# Patient Record
Sex: Female | Born: 1980 | Race: Black or African American | Hispanic: No | Marital: Single | Smoking: Never smoker
Health system: Southern US, Community
[De-identification: ages and names within clinical notes are randomized; demographics above are authoritative.]

## PROBLEM LIST (undated history)

## (undated) ENCOUNTER — Inpatient Hospital Stay (HOSPITAL_COMMUNITY): Payer: Self-pay

## (undated) DIAGNOSIS — Z789 Other specified health status: Secondary | ICD-10-CM

## (undated) HISTORY — PX: NO PAST SURGERIES: SHX2092

---
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
## 2001-04-25 ENCOUNTER — Emergency Department (HOSPITAL_COMMUNITY): Admission: EM | Admit: 2001-04-25 | Discharge: 2001-04-25 | Payer: Self-pay | Admitting: Emergency Medicine

## 2001-11-26 ENCOUNTER — Inpatient Hospital Stay (HOSPITAL_COMMUNITY): Admission: AD | Admit: 2001-11-26 | Discharge: 2001-11-26 | Payer: Self-pay | Admitting: Obstetrics and Gynecology

## 2001-12-23 ENCOUNTER — Emergency Department (HOSPITAL_COMMUNITY): Admission: EM | Admit: 2001-12-23 | Discharge: 2001-12-23 | Payer: Self-pay | Admitting: Emergency Medicine

## 2002-01-30 ENCOUNTER — Encounter: Admission: RE | Admit: 2002-01-30 | Discharge: 2002-01-30 | Payer: Self-pay | Admitting: Internal Medicine

## 2002-02-13 ENCOUNTER — Encounter: Payer: Self-pay | Admitting: *Deleted

## 2002-02-13 ENCOUNTER — Inpatient Hospital Stay (HOSPITAL_COMMUNITY): Admission: AD | Admit: 2002-02-13 | Discharge: 2002-02-13 | Payer: Self-pay | Admitting: Obstetrics and Gynecology

## 2002-03-16 ENCOUNTER — Ambulatory Visit (HOSPITAL_COMMUNITY): Admission: RE | Admit: 2002-03-16 | Discharge: 2002-03-16 | Payer: Self-pay | Admitting: *Deleted

## 2002-05-11 ENCOUNTER — Ambulatory Visit (HOSPITAL_COMMUNITY): Admission: RE | Admit: 2002-05-11 | Discharge: 2002-05-11 | Payer: Self-pay | Admitting: *Deleted

## 2002-10-14 ENCOUNTER — Encounter (HOSPITAL_COMMUNITY): Admission: RE | Admit: 2002-10-14 | Discharge: 2002-10-17 | Payer: Self-pay | Admitting: Family Medicine

## 2002-10-18 ENCOUNTER — Inpatient Hospital Stay (HOSPITAL_COMMUNITY): Admission: AD | Admit: 2002-10-18 | Discharge: 2002-10-20 | Payer: Self-pay | Admitting: *Deleted

## 2003-02-25 ENCOUNTER — Inpatient Hospital Stay (HOSPITAL_COMMUNITY): Admission: AD | Admit: 2003-02-25 | Discharge: 2003-02-25 | Payer: Self-pay | Admitting: *Deleted

## 2003-07-04 ENCOUNTER — Emergency Department (HOSPITAL_COMMUNITY): Admission: EM | Admit: 2003-07-04 | Discharge: 2003-07-04 | Payer: Self-pay | Admitting: Emergency Medicine

## 2003-11-25 ENCOUNTER — Inpatient Hospital Stay (HOSPITAL_COMMUNITY): Admission: AD | Admit: 2003-11-25 | Discharge: 2003-11-25 | Payer: Self-pay | Admitting: Obstetrics & Gynecology

## 2004-03-12 ENCOUNTER — Inpatient Hospital Stay (HOSPITAL_COMMUNITY): Admission: AD | Admit: 2004-03-12 | Discharge: 2004-03-12 | Payer: Self-pay | Admitting: *Deleted

## 2004-07-13 ENCOUNTER — Inpatient Hospital Stay (HOSPITAL_COMMUNITY): Admission: AD | Admit: 2004-07-13 | Discharge: 2004-07-13 | Payer: Self-pay | Admitting: *Deleted

## 2004-11-25 ENCOUNTER — Inpatient Hospital Stay (HOSPITAL_COMMUNITY): Admission: AD | Admit: 2004-11-25 | Discharge: 2004-11-25 | Payer: Self-pay | Admitting: Obstetrics and Gynecology

## 2005-01-31 ENCOUNTER — Ambulatory Visit: Payer: Self-pay | Admitting: Internal Medicine

## 2005-02-07 ENCOUNTER — Ambulatory Visit (HOSPITAL_COMMUNITY): Admission: RE | Admit: 2005-02-07 | Discharge: 2005-02-07 | Payer: Self-pay | Admitting: Obstetrics and Gynecology

## 2005-02-16 ENCOUNTER — Ambulatory Visit: Payer: Self-pay | Admitting: Internal Medicine

## 2005-12-07 ENCOUNTER — Inpatient Hospital Stay (HOSPITAL_COMMUNITY): Admission: AD | Admit: 2005-12-07 | Discharge: 2005-12-07 | Payer: Self-pay | Admitting: Obstetrics & Gynecology

## 2006-05-05 ENCOUNTER — Inpatient Hospital Stay (HOSPITAL_COMMUNITY): Admission: AD | Admit: 2006-05-05 | Discharge: 2006-05-05 | Payer: Self-pay | Admitting: Obstetrics and Gynecology

## 2006-05-05 ENCOUNTER — Encounter (INDEPENDENT_AMBULATORY_CARE_PROVIDER_SITE_OTHER): Payer: Self-pay | Admitting: Specialist

## 2006-05-07 ENCOUNTER — Inpatient Hospital Stay (HOSPITAL_COMMUNITY): Admission: AD | Admit: 2006-05-07 | Discharge: 2006-05-07 | Payer: Self-pay | Admitting: Family Medicine

## 2006-05-24 DIAGNOSIS — N949 Unspecified condition associated with female genital organs and menstrual cycle: Secondary | ICD-10-CM | POA: Insufficient documentation

## 2006-06-27 DIAGNOSIS — N940 Mittelschmerz: Secondary | ICD-10-CM | POA: Insufficient documentation

## 2007-07-15 ENCOUNTER — Inpatient Hospital Stay (HOSPITAL_COMMUNITY): Admission: AD | Admit: 2007-07-15 | Discharge: 2007-07-15 | Payer: Self-pay | Admitting: Obstetrics

## 2007-11-24 ENCOUNTER — Inpatient Hospital Stay (HOSPITAL_COMMUNITY): Admission: AD | Admit: 2007-11-24 | Discharge: 2007-11-24 | Payer: Self-pay | Admitting: Obstetrics & Gynecology

## 2008-04-10 ENCOUNTER — Inpatient Hospital Stay (HOSPITAL_COMMUNITY): Admission: AD | Admit: 2008-04-10 | Discharge: 2008-04-10 | Payer: Self-pay | Admitting: Family Medicine

## 2010-01-10 ENCOUNTER — Inpatient Hospital Stay (HOSPITAL_COMMUNITY): Admission: AD | Admit: 2010-01-10 | Discharge: 2010-01-11 | Payer: Self-pay | Admitting: Obstetrics and Gynecology

## 2010-01-10 ENCOUNTER — Ambulatory Visit: Payer: Self-pay | Admitting: Nurse Practitioner

## 2010-04-09 ENCOUNTER — Ambulatory Visit: Payer: Self-pay | Admitting: Advanced Practice Midwife

## 2010-04-09 ENCOUNTER — Inpatient Hospital Stay (HOSPITAL_COMMUNITY): Admission: AD | Admit: 2010-04-09 | Discharge: 2010-04-09 | Payer: Self-pay | Admitting: Obstetrics & Gynecology

## 2010-04-12 ENCOUNTER — Inpatient Hospital Stay (HOSPITAL_COMMUNITY): Admission: AD | Admit: 2010-04-12 | Discharge: 2010-04-12 | Payer: Self-pay | Admitting: Obstetrics & Gynecology

## 2010-04-12 ENCOUNTER — Ambulatory Visit: Payer: Self-pay | Admitting: Nurse Practitioner

## 2010-07-12 ENCOUNTER — Inpatient Hospital Stay (HOSPITAL_COMMUNITY)
Admission: AD | Admit: 2010-07-12 | Discharge: 2010-07-12 | Payer: Self-pay | Source: Home / Self Care | Attending: Obstetrics & Gynecology | Admitting: Obstetrics & Gynecology

## 2010-07-12 LAB — URINALYSIS, ROUTINE W REFLEX MICROSCOPIC
Bilirubin Urine: NEGATIVE
Ketones, ur: NEGATIVE mg/dL
Leukocytes, UA: NEGATIVE
Nitrite: NEGATIVE
Protein, ur: NEGATIVE mg/dL
Specific Gravity, Urine: >1.03 — ABNORMAL HIGH (ref 1.005–1.030)
Urine Glucose, Fasting: NEGATIVE mg/dL
Urobilinogen, UA: 0.2 mg/dL (ref 0.0–1.0)
pH: 6 (ref 5.0–8.0)

## 2010-07-12 LAB — URINE MICROSCOPIC-ADD ON

## 2010-07-12 LAB — WET PREP, GENITAL
Trich, Wet Prep: NONE SEEN
Yeast Wet Prep HPF POC: NONE SEEN

## 2010-07-12 LAB — POCT PREGNANCY, URINE: Preg Test, Ur: NEGATIVE

## 2010-07-13 LAB — GC/CHLAMYDIA PROBE AMP, GENITAL
Chlamydia, DNA Probe: NEGATIVE
GC Probe Amp, Genital: NEGATIVE

## 2010-09-20 LAB — WET PREP, GENITAL: Trich, Wet Prep: NONE SEEN

## 2010-09-24 LAB — URINE MICROSCOPIC-ADD ON

## 2010-09-24 LAB — URINALYSIS, ROUTINE W REFLEX MICROSCOPIC
Bilirubin Urine: NEGATIVE
Glucose, UA: NEGATIVE mg/dL
Ketones, ur: NEGATIVE mg/dL
Leukocytes, UA: NEGATIVE
Nitrite: NEGATIVE
Protein, ur: NEGATIVE mg/dL
Specific Gravity, Urine: >1.03 — ABNORMAL HIGH (ref 1.005–1.030)
Urobilinogen, UA: 0.2 mg/dL (ref 0.0–1.0)
pH: 6 (ref 5.0–8.0)

## 2010-09-24 LAB — WET PREP, GENITAL
Trich, Wet Prep: NONE SEEN
Yeast Wet Prep HPF POC: NONE SEEN

## 2010-09-24 LAB — CBC
HCT: 35.6 % — ABNORMAL LOW (ref 36.0–46.0)
Hemoglobin: 11.7 g/dL — ABNORMAL LOW (ref 12.0–15.0)
MCH: 28.4 pg (ref 26.0–34.0)
MCHC: 32.8 g/dL (ref 30.0–36.0)
MCV: 86.6 fL (ref 78.0–100.0)
Platelets: 193 10*3/uL (ref 150–400)
RBC: 4.11 MIL/uL (ref 3.87–5.11)
RDW: 14.6 % (ref 11.5–15.5)
WBC: 8 10*3/uL (ref 4.0–10.5)

## 2010-09-24 LAB — GC/CHLAMYDIA PROBE AMP, GENITAL
Chlamydia, DNA Probe: NEGATIVE
GC Probe Amp, Genital: NEGATIVE

## 2010-09-24 LAB — POCT PREGNANCY, URINE: Preg Test, Ur: NEGATIVE

## 2011-03-28 LAB — URINALYSIS, ROUTINE W REFLEX MICROSCOPIC
Bilirubin Urine: NEGATIVE
Glucose, UA: NEGATIVE
Ketones, ur: NEGATIVE
Nitrite: NEGATIVE
Protein, ur: NEGATIVE
Specific Gravity, Urine: 1.02
Urobilinogen, UA: 0.2
pH: 6.5

## 2011-03-28 LAB — URINE MICROSCOPIC-ADD ON

## 2011-03-28 LAB — POCT PREGNANCY, URINE
Operator id: 220991
Preg Test, Ur: NEGATIVE

## 2011-04-04 LAB — URINALYSIS, ROUTINE W REFLEX MICROSCOPIC
Bilirubin Urine: NEGATIVE
Glucose, UA: NEGATIVE
Hgb urine dipstick: NEGATIVE
Ketones, ur: 15 — AB
Nitrite: NEGATIVE
Protein, ur: NEGATIVE
Specific Gravity, Urine: 1.025
Urobilinogen, UA: 0.2
pH: 7

## 2011-04-04 LAB — POCT PREGNANCY, URINE
Operator id: 120561
Preg Test, Ur: NEGATIVE

## 2011-04-09 LAB — URINE MICROSCOPIC-ADD ON

## 2011-04-09 LAB — POCT PREGNANCY, URINE: Preg Test, Ur: NEGATIVE

## 2011-04-09 LAB — URINALYSIS, ROUTINE W REFLEX MICROSCOPIC
Bilirubin Urine: NEGATIVE
Glucose, UA: NEGATIVE
Ketones, ur: NEGATIVE
Nitrite: NEGATIVE
Protein, ur: NEGATIVE
Specific Gravity, Urine: 1.025
Urobilinogen, UA: 0.2
pH: 6

## 2011-04-09 LAB — WET PREP, GENITAL

## 2011-04-09 LAB — GC/CHLAMYDIA PROBE AMP, GENITAL: Chlamydia, DNA Probe: NEGATIVE

## 2012-07-27 ENCOUNTER — Inpatient Hospital Stay (HOSPITAL_COMMUNITY)
Admission: AD | Admit: 2012-07-27 | Discharge: 2012-07-27 | Disposition: A | Discharge status: Home / Self Care | Payer: Self-pay | Source: Ambulatory Visit | Attending: Obstetrics & Gynecology | Admitting: Obstetrics & Gynecology

## 2012-07-27 ENCOUNTER — Encounter (HOSPITAL_COMMUNITY): Payer: Self-pay | Admitting: *Deleted

## 2012-07-27 DIAGNOSIS — L293 Anogenital pruritus, unspecified: Secondary | ICD-10-CM | POA: Insufficient documentation

## 2012-07-27 DIAGNOSIS — N760 Acute vaginitis: Secondary | ICD-10-CM

## 2012-07-27 DIAGNOSIS — B9689 Other specified bacterial agents as the cause of diseases classified elsewhere: Secondary | ICD-10-CM

## 2012-07-27 DIAGNOSIS — R109 Unspecified abdominal pain: Secondary | ICD-10-CM

## 2012-07-27 DIAGNOSIS — B373 Candidiasis of vulva and vagina: Secondary | ICD-10-CM | POA: Insufficient documentation

## 2012-07-27 DIAGNOSIS — A499 Bacterial infection, unspecified: Secondary | ICD-10-CM

## 2012-07-27 DIAGNOSIS — B3731 Acute candidiasis of vulva and vagina: Secondary | ICD-10-CM

## 2012-07-27 HISTORY — DX: Other specified health status: Z78.9

## 2012-07-27 LAB — URINE MICROSCOPIC-ADD ON

## 2012-07-27 LAB — URINALYSIS, ROUTINE W REFLEX MICROSCOPIC
Glucose, UA: NEGATIVE mg/dL
Ketones, ur: NEGATIVE mg/dL
Nitrite: NEGATIVE
Protein, ur: NEGATIVE mg/dL
pH: 7 (ref 5.0–8.0)

## 2012-07-27 LAB — WET PREP, GENITAL: Yeast Wet Prep HPF POC: NONE SEEN

## 2012-07-27 MED ORDER — FLUCONAZOLE 150 MG PO TABS: 150.0000 mg | ORAL_TABLET | Freq: Once | ORAL | Status: DC

## 2012-07-27 MED ORDER — NORGESTIMATE-ETH ESTRADIOL 0.25-35 MG-MCG PO TABS: 1.0000 | ORAL_TABLET | Freq: Every day | ORAL | Status: DC

## 2012-07-27 MED ORDER — METRONIDAZOLE 500 MG PO TABS: 500.0000 mg | ORAL_TABLET | Freq: Two times a day (BID) | ORAL | Status: DC

## 2012-07-27 NOTE — MAU Provider Note (Signed)
History     CSN: [id number]  Arrival date and time: 07/27/12 4098   First Provider Initiated Contact with Patient 07/27/12 [phone]      Chief Complaint  Patient presents with  . Abdominal Pain   HPI  Pt is not pregnant and complains of vaginal itching- she is also having vaginal/perineal pain.  She used 1 day cream for yeast without relief.  She has had some nausea but no vomiting.  She has RCM.  She is not using anything for contraception.  Pt is also having right mid abdominal cramping that comes and goes.  Pt is constipated and has not had a bowel movement since last week.  Past Medical History  Diagnosis Date  . No pertinent past medical history     Past Surgical History  Procedure Date  . No past surgeries     No family history on file.  History  Substance Use Topics  . Smoking status: Never Smoker   . Smokeless tobacco: Not on file  . Alcohol Use: No    Allergies: No Known Allergies  No prescriptions prior to admission    Review of Systems  Constitutional: Negative for fever and chills.  Gastrointestinal: Positive for nausea, abdominal pain and constipation. Negative for vomiting and diarrhea.  Genitourinary: Negative for dysuria and urgency.  Musculoskeletal: Negative for back pain.   Physical Exam   Blood pressure 119/74, pulse 81, temperature 97.6 F (36.4 C), temperature source Oral, resp. rate 18, height 5\' 3"  (1.6 m), weight 173 lb 12.8 oz (78.835 kg), last menstrual period 07/15/2012.  Physical Exam  Nursing note and vitals reviewed. Constitutional: She is oriented to person, place, and time. She appears well-developed and well-nourished.  HENT:  Head: Normocephalic.  Eyes: Pupils are equal, round, and reactive to light.  Neck: Normal range of motion. Neck supple.  Cardiovascular: Normal rate.   Respiratory: Effort normal and breath sounds normal.  GI: Soft. She exhibits no distension. There is no tenderness. There is no rebound and no guarding.   Genitourinary: Vagina normal.       Vaginal mucosa reddened and slightly edematous; no lesions noted; mod amount of white clumpy discharge in vault (?discharge vs. Cream) and some frothiness noted.  Cervix clean, NT; uterus NSSC NT; adnexal without palpable enlargement- mild tenderness right adnexa- no rebound  Musculoskeletal: Normal range of motion.  Neurological: She is alert and oriented to person, place, and time.  Skin: Skin is warm and dry.  Psychiatric: She has a normal mood and affect.    MAU Course  Procedures Results for orders placed during the hospital encounter of 07/27/12 (from the past 24 hour(s))  URINALYSIS, ROUTINE W REFLEX MICROSCOPIC     Status: Abnormal   Collection Time   07/27/12  7:25 AM      Component Value Range   Color, Urine YELLOW  YELLOW   APPearance CLOUDY (*) CLEAR   Specific Gravity, Urine 1.020  1.005 - 1.030   pH 7.0  5.0 - 8.0   Glucose, UA NEGATIVE  NEGATIVE mg/dL   Hgb urine dipstick NEGATIVE  NEGATIVE   Bilirubin Urine NEGATIVE  NEGATIVE   Ketones, ur NEGATIVE  NEGATIVE mg/dL   Protein, ur NEGATIVE  NEGATIVE mg/dL   Urobilinogen, UA 2.0 (*) 0.0 - 1.0 mg/dL   Nitrite NEGATIVE  NEGATIVE   Leukocytes, UA MODERATE (*) NEGATIVE  URINE MICROSCOPIC-ADD ON     Status: Abnormal   Collection Time   07/27/12  7:25 AM  Component Value Range   Squamous Epithelial / LPF FEW (*) RARE   WBC, UA 21-50  <3 WBC/hpf   RBC / HPF 0-2  <3 RBC/hpf   Bacteria, UA MANY (*) RARE  WET PREP, GENITAL     Status: Abnormal   Collection Time   07/27/12  8:45 AM      Component Value Range   Yeast Wet Prep HPF POC NONE SEEN  NONE SEEN   Trich, Wet Prep NONE SEEN  NONE SEEN   Clue Cells Wet Prep HPF POC FEW (*) NONE SEEN   WBC, Wet Prep HPF POC MANY (*) NONE SEEN    Assessment and Plan  Bacterial vaginosis- Flagyl 500mg  BID for 7 days ?yeast- Diflucan 150mg  - one now and repeat in 3 days if still having symptoms Contraception advised- information on choices  given to pt- f/u with [facility]  Jaylissa Felty 07/27/2012, 8:26 AM

## 2012-07-27 NOTE — MAU Note (Signed)
Pt reports she started having abd pain since Thursday. Thought she might have a yeast infection and took the one day treatment and it did not work pain is worse.

## 2012-07-28 LAB — URINE CULTURE: Colony Count: >=100000

## 2012-07-28 LAB — GC/CHLAMYDIA PROBE AMP
CT Probe RNA: NEGATIVE
GC Probe RNA: NEGATIVE

## 2012-07-28 NOTE — MAU Provider Note (Signed)
Attestation of Attending Supervision of Advanced Practitioner: Evaluation and management procedures were performed by the PA/NP/CNM/OB Fellow under my supervision/collaboration. Chart reviewed and agree with management and plan.  Gumaro Brightbill V 07/28/2012 10:26 PM

## 2013-12-03 ENCOUNTER — Encounter (HOSPITAL_COMMUNITY): Payer: Self-pay | Admitting: *Deleted

## 2013-12-03 ENCOUNTER — Inpatient Hospital Stay (HOSPITAL_COMMUNITY)
Admission: AD | Admit: 2013-12-03 | Discharge: 2013-12-03 | Disposition: A | Discharge status: Home / Self Care | Payer: Managed Care, Other (non HMO) | Source: Ambulatory Visit | Attending: Obstetrics & Gynecology | Admitting: Obstetrics & Gynecology

## 2013-12-03 DIAGNOSIS — N760 Acute vaginitis: Secondary | ICD-10-CM

## 2013-12-03 DIAGNOSIS — R109 Unspecified abdominal pain: Secondary | ICD-10-CM

## 2013-12-03 DIAGNOSIS — B9689 Other specified bacterial agents as the cause of diseases classified elsewhere: Secondary | ICD-10-CM

## 2013-12-03 DIAGNOSIS — B373 Candidiasis of vulva and vagina: Secondary | ICD-10-CM

## 2013-12-03 DIAGNOSIS — B3731 Acute candidiasis of vulva and vagina: Secondary | ICD-10-CM

## 2013-12-03 DIAGNOSIS — N898 Other specified noninflammatory disorders of vagina: Secondary | ICD-10-CM | POA: Insufficient documentation

## 2013-12-03 DIAGNOSIS — N949 Unspecified condition associated with female genital organs and menstrual cycle: Secondary | ICD-10-CM | POA: Insufficient documentation

## 2013-12-03 DIAGNOSIS — R102 Pelvic and perineal pain: Secondary | ICD-10-CM

## 2013-12-03 DIAGNOSIS — A499 Bacterial infection, unspecified: Secondary | ICD-10-CM

## 2013-12-03 LAB — WET PREP, GENITAL
Trich, Wet Prep: NONE SEEN
Yeast Wet Prep HPF POC: NONE SEEN

## 2013-12-03 LAB — URINE MICROSCOPIC-ADD ON

## 2013-12-03 LAB — URINALYSIS, ROUTINE W REFLEX MICROSCOPIC
BILIRUBIN URINE: NEGATIVE
Glucose, UA: NEGATIVE mg/dL
KETONES UR: 15 mg/dL — AB
NITRITE: NEGATIVE
PROTEIN: NEGATIVE mg/dL
SPECIFIC GRAVITY, URINE: 1.02 (ref 1.005–1.030)
UROBILINOGEN UA: 1 mg/dL (ref 0.0–1.0)
pH: 6 (ref 5.0–8.0)

## 2013-12-03 LAB — POCT PREGNANCY, URINE: PREG TEST UR: NEGATIVE

## 2013-12-03 NOTE — MAU Provider Note (Signed)
History     CSN: [id number]  Arrival date and time: 12/03/13 2019   First Provider Initiated Contact with Patient 12/03/13 2121      No chief complaint on file.  HPI  Diane Sullivan is a 33 y.o. G2P1011 who presents today with lower abdominal cramping and vaginal discharge. She states that the cramping and discharge started a "couple of days ago". [facility], but couldn't make an appointment because of her work hours. She has not tried any medication for the cramping. She rates her pain 4/10 at this time.   Past Medical History  Diagnosis Date  . No pertinent past medical history     Past Surgical History  Procedure Laterality Date  . No past surgeries      Family History  Problem Relation Age of Onset  . Hypertension Mother   . Cancer Maternal Aunt   . Cancer Maternal Grandmother   . Heart disease Neg Hx   . Diabetes Neg Hx     History  Substance Use Topics  . Smoking status: Never Smoker   . Smokeless tobacco: Not on file  . Alcohol Use: No    Allergies: No Known Allergies  Prescriptions prior to admission  Medication Sig Dispense Refill  . fluconazole (DIFLUCAN) 150 MG tablet Take 1 tablet (150 mg total) by mouth once.  2 tablet  3  . metroNIDAZOLE (FLAGYL) 500 MG tablet Take 1 tablet (500 mg total) by mouth 2 (two) times daily.  14 tablet  0  . norgestimate-ethinyl estradiol (ORTHO-CYCLEN,SPRINTEC,PREVIFEM) 0.25-35 MG-MCG tablet Take 1 tablet by mouth daily.  1 Package  11    ROS Physical Exam   Blood pressure 119/76, pulse 84, temperature 98.3 F (36.8 C), temperature source Oral, resp. rate 18.  Physical Exam  Nursing note and vitals reviewed. Constitutional: She is oriented to person, place, and time. She appears well-developed and well-nourished. No distress.  Cardiovascular: Normal rate.   Respiratory: Effort normal.  GI: Soft.  Genitourinary:   External: no lesion Vagina: small amount of white  discharge Cervix: pink, small herpatic appearing lesion at 2:00 on the cervix. Culture obtained.  no CMT Uterus: NSSC Adnexa: NT   Neurological: She is alert and oriented to person, place, and time.  Skin: Skin is warm and dry.  Psychiatric: She has a normal mood and affect.    MAU Course  Procedures  Results for orders placed during the hospital encounter of 12/03/13 (from the past 24 hour(s))  URINALYSIS, ROUTINE W REFLEX MICROSCOPIC     Status: Abnormal   Collection Time    12/03/13  9:10 PM      Result Value Ref Range   Color, Urine YELLOW  YELLOW   APPearance CLEAR  CLEAR   Specific Gravity, Urine 1.020  1.005 - 1.030   pH 6.0  5.0 - 8.0   Glucose, UA NEGATIVE  NEGATIVE mg/dL   Hgb urine dipstick TRACE (*) NEGATIVE   Bilirubin Urine NEGATIVE  NEGATIVE   Ketones, ur 15 (*) NEGATIVE mg/dL   Protein, ur NEGATIVE  NEGATIVE mg/dL   Urobilinogen, UA 1.0  0.0 - 1.0 mg/dL   Nitrite NEGATIVE  NEGATIVE   Leukocytes, UA SMALL (*) NEGATIVE  URINE MICROSCOPIC-ADD ON     Status: Abnormal   Collection Time    12/03/13  9:10 PM      Result Value Ref Range   Squamous Epithelial / LPF FEW (*) RARE   WBC, UA 3-6  <  3 WBC/hpf   Bacteria, UA FEW (*) RARE   Urine-Other MUCOUS PRESENT    POCT PREGNANCY, URINE     Status: None   Collection Time    12/03/13  9:15 PM      Result Value Ref Range   Preg Test, Ur NEGATIVE  NEGATIVE  WET PREP, GENITAL     Status: Abnormal   Collection Time    12/03/13  9:35 PM      Result Value Ref Range   Yeast Wet Prep HPF POC NONE SEEN  NONE SEEN   Trich, Wet Prep NONE SEEN  NONE SEEN   Clue Cells Wet Prep HPF POC FEW (*) NONE SEEN   WBC, Wet Prep HPF POC FEW (*) NONE SEEN     Assessment and Plan   1. Pelvic pain    Urine Culture, GC/CT and HSV cultures pending Return to MAU as needed  Follow-up Information   Follow up with [facility] [place]. (As needed)    Contact information:   [address] [place] [address] [phone]        Tawnya Crook 12/03/2013, 9:27 PM

## 2013-12-03 NOTE — Discharge Instructions (Signed)
Safe Sex Safe sex is about reducing the risk of giving or getting a sexually transmitted disease (STD). STDs are spread through sexual contact involving the genitals, mouth, or rectum. Some STDS can be cured and others cannot. Safe sex can also prevent unintended pregnancies.  SAFE SEX PRACTICES  Limit your sexual activity to only one partner who is only having sex with you.  Talk to your partner about their past partners, past STDs, and drug use.  Use a condom every time you have sexual intercourse. This includes vaginal, oral, and anal sexual activity. Both females and males should wear condoms during oral sex. Only use latex or polyurethane condoms and water-based lubricants. Petroleum-based lubricants or oils used to lubricate a condom will weaken the condom and increase the chance that it will break. The condom should be in place from the beginning to the end of sexual activity. Wearing a condom reduces, but does not completely eliminate, your risk of getting or giving a STD. STDs can be spread by contact with skin of surrounding areas.  Get vaccinated for hepatitis B and HPV.  Avoid alcohol and recreational drugs which can affect your judgement. You may forget to use a condom or participate in high-risk sex.  For females, avoid douching after sexual intercourse. Douching can spread an infection farther into the reproductive tract.  Check your body for signs of sores, blisters, rashes, or unusual discharge. See your caregiver if you notice any of these signs.  Avoid sexual contact if you have symptoms of an infection or are being treated for an STD. If you or your partner has herpes, avoid sexual contact when blisters are present. Use condoms at all other times.  See your caregiver for regular screenings, examinations, and tests for STDs. Before having sex with a new partner, each of you should be screened for STDs and talk about the results with your partner. BENEFITS OF SAFE SEX   There  is less of a chance of getting or giving an STD.  You can prevent unwanted or unintended pregnancies.  By discussing safer sex concerns with your partner, you may increase feelings of intimacy, comfort, trust, and honesty between the both of you. Document Released: 08/02/2004 Document Revised: 03/19/2012 Document Reviewed: 12/17/2011 ExitCare Patient Information 2014 [employer].  

## 2013-12-03 NOTE — MAU Note (Signed)
Pt reports stomach pain and discharge since Saturday. Pt states that LMP was 5/9, but a condom broke during intercourse.

## 2013-12-03 NOTE — MAU Note (Signed)
Pt not in waiting area when called by nurse first for info.

## 2013-12-04 LAB — URINE CULTURE
Colony Count: NO GROWTH
Culture: NO GROWTH

## 2013-12-04 LAB — GC/CHLAMYDIA PROBE AMP
CT PROBE, AMP APTIMA: NEGATIVE
GC PROBE AMP APTIMA: NEGATIVE

## 2013-12-05 LAB — HSV PCR
HSV 2 , PCR: NOT DETECTED
HSV BY PCR: NOT DETECTED

## 2013-12-08 NOTE — MAU Provider Note (Signed)
Attestation of Attending Supervision of Advanced Practitioner (CNM/NP): Evaluation and management procedures were performed by the Advanced Practitioner under my supervision and collaboration. I have reviewed the Advanced Practitioner's note and chart, and I agree with the management and plan.  Diane Sullivan 2:42 PM

## 2013-12-20 ENCOUNTER — Encounter (HOSPITAL_COMMUNITY): Payer: Self-pay | Admitting: *Deleted

## 2013-12-20 ENCOUNTER — Inpatient Hospital Stay (HOSPITAL_COMMUNITY)
Admission: AD | Admit: 2013-12-20 | Discharge: 2013-12-20 | Disposition: A | Discharge status: Home / Self Care | Payer: Managed Care, Other (non HMO) | Source: Ambulatory Visit | Attending: Family Medicine | Admitting: Family Medicine

## 2013-12-20 DIAGNOSIS — N760 Acute vaginitis: Secondary | ICD-10-CM | POA: Insufficient documentation

## 2013-12-20 DIAGNOSIS — Z309 Encounter for contraceptive management, unspecified: Secondary | ICD-10-CM | POA: Insufficient documentation

## 2013-12-20 DIAGNOSIS — B9689 Other specified bacterial agents as the cause of diseases classified elsewhere: Secondary | ICD-10-CM | POA: Insufficient documentation

## 2013-12-20 DIAGNOSIS — A499 Bacterial infection, unspecified: Secondary | ICD-10-CM | POA: Insufficient documentation

## 2013-12-20 LAB — WET PREP, GENITAL
Trich, Wet Prep: NONE SEEN
Yeast Wet Prep HPF POC: NONE SEEN

## 2013-12-20 LAB — URINALYSIS, ROUTINE W REFLEX MICROSCOPIC
BILIRUBIN URINE: NEGATIVE
GLUCOSE, UA: NEGATIVE mg/dL
KETONES UR: NEGATIVE mg/dL
LEUKOCYTES UA: NEGATIVE
NITRITE: NEGATIVE
PROTEIN: NEGATIVE mg/dL
Specific Gravity, Urine: 1.025 (ref 1.005–1.030)
Urobilinogen, UA: 1 mg/dL (ref 0.0–1.0)
pH: 6 (ref 5.0–8.0)

## 2013-12-20 LAB — URINE MICROSCOPIC-ADD ON

## 2013-12-20 LAB — POCT PREGNANCY, URINE: PREG TEST UR: NEGATIVE

## 2013-12-20 MED ORDER — METRONIDAZOLE 500 MG PO TABS: 500.0000 mg | ORAL_TABLET | Freq: Two times a day (BID) | ORAL | Status: DC

## 2013-12-20 MED ORDER — FLUCONAZOLE 150 MG PO TABS: 150.0000 mg | ORAL_TABLET | Freq: Every day | ORAL | Status: DC

## 2013-12-20 MED ORDER — NORELGESTROMIN-ETH ESTRADIOL 150-35 MCG/24HR TD PTWK: 1.0000 | MEDICATED_PATCH | TRANSDERMAL | Status: DC

## 2013-12-20 NOTE — MAU Provider Note (Signed)
History     CSN: [id number]  Arrival date and time: 12/20/13 1349   First Provider Initiated Contact with Patient 12/20/13 1432      Chief Complaint  Patient presents with  . Vaginal Discharge   HPI Comments: Diane Sullivan 33 y.o. [id number] presents to MAU with vaginal discharge. She was here in May with same symptoms but was not treated. She is sexually active with same partner using condoms part of the time. Last unprotected intercourse was last Monday. We discussed birth control options.  Vaginal Discharge      Past Medical History  Diagnosis Date  . No pertinent past medical history   . Medical history non-contributory     Past Surgical History  Procedure Laterality Date  . No past surgeries      Family History  Problem Relation Age of Onset  . Hypertension Mother   . Cancer Maternal Aunt   . Cancer Maternal Grandmother   . Heart disease Neg Hx   . Diabetes Neg Hx     History  Substance Use Topics  . Smoking status: Never Smoker   . Smokeless tobacco: Never Used  . Alcohol Use: No    Allergies: No Known Allergies  Prescriptions prior to admission  Medication Sig Dispense Refill  . fluconazole (DIFLUCAN) 150 MG tablet Take 1 tablet (150 mg total) by mouth once.  2 tablet  3  . metroNIDAZOLE (FLAGYL) 500 MG tablet Take 1 tablet (500 mg total) by mouth 2 (two) times daily.  14 tablet  0  . norgestimate-ethinyl estradiol (ORTHO-CYCLEN,SPRINTEC,PREVIFEM) 0.25-35 MG-MCG tablet Take 1 tablet by mouth daily.  1 Package  11    Review of Systems  Constitutional: Negative.   HENT: Negative.   Eyes: Negative.   Respiratory: Negative.   Cardiovascular: Negative.   Genitourinary: Negative.        Vaginal discharge  Musculoskeletal: Negative.   Skin: Negative.   Neurological: Negative.   Psychiatric/Behavioral: Negative.    Physical Exam   Blood pressure 119/67, pulse 86, temperature 98.2 F (36.8 C), temperature source Oral, resp. rate 16, height 5\' 3"   (1.6 m), weight 79.379 kg (175 lb), last menstrual period 12/11/2013.  Physical Exam  Constitutional: She is oriented to person, place, and time. She appears well-developed and well-nourished. No distress.  HENT:  Head: Normocephalic and atraumatic.  Eyes: Pupils are equal, round, and reactive to light.  GI: Soft. Bowel sounds are normal. She exhibits no distension. There is no tenderness. There is no rebound and no guarding.  Genitourinary:  Genital:external negative Vaginal:moderate amount white odorous discharge Cervix:closed/ thick Bimanual: No CMT   Musculoskeletal: Normal range of motion.  Neurological: She is alert and oriented to person, place, and time.  Skin: Skin is warm and dry.  Psychiatric: She has a normal mood and affect. Her behavior is normal. Judgment and thought content normal.   Results for orders placed during the hospital encounter of 12/20/13 (from the past 24 hour(s))  URINALYSIS, ROUTINE W REFLEX MICROSCOPIC     Status: Abnormal   Collection Time    12/20/13  2:00 PM      Result Value Ref Range   Color, Urine YELLOW  YELLOW   APPearance CLEAR  CLEAR   Specific Gravity, Urine 1.025  1.005 - 1.030   pH 6.0  5.0 - 8.0   Glucose, UA NEGATIVE  NEGATIVE mg/dL   Hgb urine dipstick TRACE (*) NEGATIVE   Bilirubin Urine NEGATIVE  NEGATIVE   Ketones, ur  NEGATIVE  NEGATIVE mg/dL   Protein, ur NEGATIVE  NEGATIVE mg/dL   Urobilinogen, UA 1.0  0.0 - 1.0 mg/dL   Nitrite NEGATIVE  NEGATIVE   Leukocytes, UA NEGATIVE  NEGATIVE  URINE MICROSCOPIC-ADD ON     Status: Abnormal   Collection Time    12/20/13  2:00 PM      Result Value Ref Range   Squamous Epithelial / LPF MANY (*) RARE   RBC / HPF 0-2  <3 RBC/hpf   Urine-Other MUCOUS PRESENT    WET PREP, GENITAL     Status: Abnormal   Collection Time    12/20/13  2:38 PM      Result Value Ref Range   Yeast Wet Prep HPF POC NONE SEEN  NONE SEEN   Trich, Wet Prep NONE SEEN  NONE SEEN   Clue Cells Wet Prep HPF POC MANY  (*) NONE SEEN   WBC, Wet Prep HPF POC FEW (*) NONE SEEN  POCT PREGNANCY, URINE     Status: None   Collection Time    12/20/13  3:00 PM      Result Value Ref Range   Preg Test, Ur NEGATIVE  NEGATIVE    MAU Course  Procedures  MDM  Wet Prep, GC, Chlamydia  Assessment and Plan   A: Bacterial Vaginosis Contraception Management  P: Flagyl 500 mg po BID x 7 days No alcohol/ intercourse x 7 days Ortho Evra Patch # # months refills Make appointment with [facility]  Aadarsh Cozort Miller 12/20/2013, 2:45 PM

## 2013-12-20 NOTE — Discharge Instructions (Signed)
Bacterial Vaginosis Bacterial vaginosis is an infection of the vagina. It happens when too many of certain germs (bacteria) grow in the vagina. HOME CARE  Take your medicine as told by your doctor.  Finish your medicine even if you start to feel better.  Do not have sex until you finish your medicine and are better.  Tell your sex partner that you have an infection. They should see their doctor for treatment.  Practice safe sex. Use condoms. Have only one sex partner. GET HELP IF:  You are not getting better after 3 days of treatment.  You have more grey fluid (discharge) coming from your vagina than before.  You have more pain than before.  You have a fever. MAKE SURE YOU:   Understand these instructions.  Will watch your condition.  Will get help right away if you are not doing well or get worse. Document Released: 04/03/2008 Document Revised: 04/15/2013 Document Reviewed: 02/04/2013 [facility] Patient Information 2014 [place], [place]. Contraception Choices Birth control (contraception) is the use of any methods or devices to stop pregnancy from happening. Below are some methods to help avoid pregnancy. HORMONAL BIRTH CONTROL  A small tube put under the skin of the upper arm (implant). The tube can stay in place for 3 years. The implant must be taken out after 3 years.  Shots given every 3 months.  Pills taken every day.  Patches that are changed once a week.  A ring put into the vagina (vaginal ring). The ring is left in place for 3 weeks and removed for 1 week. Then, a new ring is put in the vagina.  Emergency birth control pills taken after unprotected sex (intercourse). BARRIER BIRTH CONTROL   A thin covering worn on the penis (female condom) during sex.  A soft, loose covering put into the vagina (female condom) before sex.  A rubber bowl that sits over the cervix (diaphragm). The bowl must be made for you. The bowl is put into the vagina before sex. The bowl is  left in place for 6 to 8 hours after sex.  A small, soft cup that fits over the cervix (cervical cap). The cup must be made for you. The cup can be left in place for 48 hours after sex.  A sponge that is put into the vagina before sex.  A chemical that kills or stops sperm from getting into the cervix and uterus (spermicide). The chemical may be a cream, jelly, foam, or pill. INTRAUTERINE (IUD) BIRTH CONTROL   IUD birth control is a small, T-shaped piece of plastic. The plastic is put inside the uterus. There are 2 types of IUD:  Copper IUD. The IUD is covered in copper wire. The copper makes a fluid that kills sperm. It can stay in place for 10 years.  Hormone IUD. The hormone stops pregnancy from happening. It can stay in place for 5 years. PERMANENT METHODS  When the woman has her fallopian tubes sealed, tied, or blocked during surgery. This stops the egg from traveling to the uterus.  The doctor places a small coil or insert into each fallopian tube. This causes scar tissue to form and blocks the fallopian tubes.  When the female has the tubes that carry sperm tied off (vasectomy). NATURAL FAMILY PLANNING BIRTH CONTROL   Natural family planning means not having sex or using barrier birth control on the days the woman could become pregnant.  Use a calendar to keep track of the length of each period  and know the days she can get pregnant. °· Avoid sex during ovulation. °· Use a thermometer to measure body temperature. Also watch for symptoms of ovulation. °· Time sex to be after the woman has ovulated. °Use condoms to help protect yourself against sexually transmitted infections (STIs). Do this no matter what type of birth control you use. Talk to your doctor about which type of birth control is best for you. °Document Released: 04/22/2009 Document Revised: 02/25/2013 Document Reviewed: 01/14/2013 °ExitCare® Patient Information ©2014 [employer]. ° °

## 2013-12-20 NOTE — MAU Note (Signed)
Patient presents with complaints of vaginal irritation, odor and discharge.

## 2013-12-21 LAB — GC/CHLAMYDIA PROBE AMP
CT PROBE, AMP APTIMA: NEGATIVE
GC PROBE AMP APTIMA: NEGATIVE

## 2013-12-21 NOTE — MAU Provider Note (Signed)
Attestation of Attending Supervision of Advanced Practitioner (PA/CNM/NP): Evaluation and management procedures were performed by the Advanced Practitioner under my supervision and collaboration.  I have reviewed the Advanced Practitioner's note and chart, and I agree with the management and plan.  PRATT,TANYA S, MD [facility] Attending 12/21/2013 5:17 AM   

## 2014-05-10 ENCOUNTER — Encounter (HOSPITAL_COMMUNITY): Payer: Self-pay | Admitting: *Deleted

## 2014-05-24 ENCOUNTER — Ambulatory Visit: Payer: Managed Care, Other (non HMO) | Admitting: Obstetrics

## 2015-01-04 ENCOUNTER — Encounter (HOSPITAL_COMMUNITY): Payer: Self-pay

## 2015-01-04 ENCOUNTER — Inpatient Hospital Stay (HOSPITAL_COMMUNITY)
Admit: 2015-01-04 | Discharge: 2015-01-04 | Disposition: A | Discharge status: Home / Self Care | Payer: Managed Care, Other (non HMO) | Source: Ambulatory Visit | Attending: Family Medicine | Admitting: Family Medicine

## 2015-01-04 ENCOUNTER — Inpatient Hospital Stay (HOSPITAL_COMMUNITY): Payer: Managed Care, Other (non HMO)

## 2015-01-04 DIAGNOSIS — N939 Abnormal uterine and vaginal bleeding, unspecified: Secondary | ICD-10-CM | POA: Diagnosis not present

## 2015-01-04 DIAGNOSIS — Z3A00 Weeks of gestation of pregnancy not specified: Secondary | ICD-10-CM | POA: Insufficient documentation

## 2015-01-04 DIAGNOSIS — O209 Hemorrhage in early pregnancy, unspecified: Secondary | ICD-10-CM | POA: Insufficient documentation

## 2015-01-04 DIAGNOSIS — O071 Delayed or excessive hemorrhage following failed attempted termination of pregnancy: Secondary | ICD-10-CM | POA: Diagnosis not present

## 2015-01-04 LAB — CBC WITH DIFFERENTIAL/PLATELET
BASOS PCT: 0 % (ref 0–1)
Basophils Absolute: 0 10*3/uL (ref 0.0–0.1)
Eosinophils Absolute: 0.1 10*3/uL (ref 0.0–0.7)
Eosinophils Relative: 2 % (ref 0–5)
HCT: 37.6 % (ref 36.0–46.0)
Hemoglobin: 12.7 g/dL (ref 12.0–15.0)
LYMPHS PCT: 38 % (ref 12–46)
Lymphs Abs: 2.8 10*3/uL (ref 0.7–4.0)
MCH: 27.8 pg (ref 26.0–34.0)
MCHC: 33.8 g/dL (ref 30.0–36.0)
MCV: 82.3 fL (ref 78.0–100.0)
MONO ABS: 0.5 10*3/uL (ref 0.1–1.0)
Monocytes Relative: 7 % (ref 3–12)
NEUTROS PCT: 53 % (ref 43–77)
Neutro Abs: 3.9 10*3/uL (ref 1.7–7.7)
PLATELETS: 256 10*3/uL (ref 150–400)
RBC: 4.57 MIL/uL (ref 3.87–5.11)
RDW: 14.2 % (ref 11.5–15.5)
WBC: 7.4 10*3/uL (ref 4.0–10.5)

## 2015-01-04 LAB — WET PREP, GENITAL
Trich, Wet Prep: NONE SEEN
Yeast Wet Prep HPF POC: NONE SEEN

## 2015-01-04 LAB — POCT PREGNANCY, URINE: Preg Test, Ur: NEGATIVE

## 2015-01-04 LAB — HCG, QUANTITATIVE, PREGNANCY: hCG, Beta Chain, Quant, S: 14 m[IU]/mL — ABNORMAL HIGH (ref <5)

## 2015-01-04 NOTE — MAU Note (Addendum)
Last Sat (10days ago), had some bleeding, saw some clots in the toilet, then it eased up- thought it was a regular period. Came on heavy again this Saturday. Still bleeding. occ cramping.  Thought might be having a miscarriage, never had a period like this.  Neg HPT

## 2015-01-04 NOTE — Discharge Instructions (Signed)

## 2015-01-04 NOTE — MAU Provider Note (Signed)
History     CSN: [id number]  Arrival date and time: 01/04/15 1743   None     Chief Complaint  Patient presents with  . Vaginal Bleeding  . Possible Pregnancy   HPI  Pt is not pregnant s/p TAB ~8 weeks April 29 and bled for a couple of weeks and then bleeding now for 10 days - filled up a whole pad in less than an hour- has changed pad 2 times today. Pt having some cramping today and took ibuprofen and felt like she started bleeding more heavy Pt is not using anything for contraception Pt denies vaginal discharge Pt denies UTI sx, constipation, diarrhea, no nausea and vomiting.  Past Medical History  Diagnosis Date  . No pertinent past medical history   . Medical history non-contributory     Past Surgical History  Procedure Laterality Date  . No past surgeries      Family History  Problem Relation Age of Onset  . Hypertension Mother   . Cancer Maternal Aunt   . Cancer Maternal Grandmother   . Heart disease Neg Hx   . Diabetes Neg Hx     History  Substance Use Topics  . Smoking status: Never Smoker   . Smokeless tobacco: Never Used  . Alcohol Use: No    Allergies: No Known Allergies  No prescriptions prior to admission    Review of Systems  Constitutional: Negative for fever and chills.  Gastrointestinal: Negative for nausea, vomiting, abdominal pain, diarrhea and constipation.  Genitourinary: Negative for dysuria and urgency.  Neurological: Negative for headaches.   Physical Exam   Blood pressure 128/85, pulse 66, temperature 98.2 F (36.8 C), resp. rate 16, weight 165 lb 4 oz (74.957 kg), last menstrual period 12/25/2014, SpO2 100 %.  Physical Exam  Nursing note and vitals reviewed. Constitutional: She is oriented to person, place, and time. She appears well-developed and well-nourished. No distress.  HENT:  Head: Normocephalic.  Eyes: Pupils are equal, round, and reactive to light.  Neck: Normal range of motion. Neck supple.  Cardiovascular:  Normal rate.   Respiratory: Effort normal.  GI: Soft. She exhibits no distension. There is no tenderness. There is no rebound and no guarding.  Genitourinary:  sm amount of dark red blood from cervix; cervix parous, clean NT; uterus and adnexa mildly tender diffusely- no appreciable enlargement  Musculoskeletal: Normal range of motion.  Neurological: She is alert and oriented to person, place, and time.  Skin: Skin is warm and dry.  Psychiatric: She has a normal mood and affect.    MAU Course  Procedures Discussed with pt if hcg was neg and normal ultrasound, then would recommend Pt starting on OCs now for effective contraception and regulation of cycles Results for orders placed or performed during the hospital encounter of 01/04/15 (from the past 24 hour(s))  Pregnancy, urine POC     Status: None   Collection Time: 01/04/15  6:33 PM  Result Value Ref Range   Preg Test, Ur NEGATIVE NEGATIVE  CBC with Differential/Platelet     Status: None   Collection Time: 01/04/15  7:04 PM  Result Value Ref Range   WBC 7.4 4.0 - 10.5 K/uL   RBC 4.57 3.87 - 5.11 MIL/uL   Hemoglobin 12.7 12.0 - 15.0 g/dL   HCT 08.6 57.8 - 46.9 %   MCV 82.3 78.0 - 100.0 fL   MCH 27.8 26.0 - 34.0 pg   MCHC 33.8 30.0 - 36.0 g/dL   RDW 62.9  11.5 - 15.5 %   Platelets 256 150 - 400 K/uL   Neutrophils Relative % 53 43 - 77 %   Neutro Abs 3.9 1.7 - 7.7 K/uL   Lymphocytes Relative 38 12 - 46 %   Lymphs Abs 2.8 0.7 - 4.0 K/uL   Monocytes Relative 7 3 - 12 %   Monocytes Absolute 0.5 0.1 - 1.0 K/uL   Eosinophils Relative 2 0 - 5 %   Eosinophils Absolute 0.1 0.0 - 0.7 K/uL   Basophils Relative 0 0 - 1 %   Basophils Absolute 0.0 0.0 - 0.1 K/uL  hCG, quantitative, pregnancy     Status: Abnormal   Collection Time: 01/04/15  7:04 PM  Result Value Ref Range   hCG, Beta Chain, Quant, S 14 (H) <5 mIU/mL  Wet prep, genital     Status: Abnormal   Collection Time: 01/04/15  8:21 PM  Result Value Ref Range   Yeast Wet Prep  HPF POC NONE SEEN NONE SEEN   Trich, Wet Prep NONE SEEN NONE SEEN   Clue Cells Wet Prep HPF POC FEW (A) NONE SEEN   WBC, Wet Prep HPF POC FEW (A) NONE SEEN  wet prep GC/Chlamydia pending Serum HCG US Care handed over to Vonzella NippleJulie Dearius Hoffmann, PA  MDM 2000 - Care assumed from Pamelia HoitSusan Lineberry, NP. Patient waiting for US, lab results pending.  Discussed US and lab results with Dr. Adrian BlackwaterStinson. He recommends follow-up for hCG in 1 week to determine incomplete AB vs new pregnancy  [place] Transvaginal Non-ob  01/04/2015   CLINICAL DATA:  34 year old female with a history of abnormal vaginal bleeding for 11 days.  EXAM: TRANSABDOMINAL ULTRASOUND OF PELVIS  TECHNIQUE: Transabdominal ultrasound examination of the pelvis was performed including evaluation of the uterus, ovaries, adnexal regions, and pelvic cul-de-sac.  COMPARISON:  05/05/2006, 02/07/2005  FINDINGS: Uterus  Measurements: 9.4 cm x 4.0 cm x 4.8 cm. No fibroids or other mass visualized.  Endometrium  Thickness: 10 mm to 11 mm at the uterine fundus, with hypoechoic appearance, with the endometrium appearing to surround this hypoechoic focus at the fundus. Color images suggest the possibility of blood flow in a potential "stalk" configuration. The endometrium is of a more narrow and uniform thickness in the mid body of the uterus and towards the lower uterine segment. The focal thickening at the uterine fundus centered in the endometrium was present on the comparison ultrasound study dated 05/05/2006.  Right ovary  Measurements: 6.5 cm x 3.7 cm x 5.6 cm. Anechoic lesion associated with the right ovary measures 5.2 cm x 3.4 cm x 4.7 cm  Left ovary  Measurements: 1.5 cm x 3.0 cm x 3.1 cm. Unremarkable appearance of the left ovary.  Other findings:  No free fluid  IMPRESSION: Hypoechoic focus centered at the endometrial stripe in the uterine fundus, with linear blood flow extending to the focus, suggesting a uterine polyp. Further evaluation with hysteroscopy may be  useful. Differential diagnosis includes retained products given the patient's recent pregnancy, though this is considered less likely given the appearance.  Cystic lesion associated with the right ovary measures just over 5 cm. Annual ultrasound survey is recommended given the patient's age and appearance, as recommended by a SRU guidelines.  Signed,  Yvone NeuJaime S. Loreta AveWagner, DO  [facility]  [facility]   Electronically Signed   By: Gilmer MorJaime  Wagner D.O.   On: 01/04/2015 21:32   [place] Pelvis Complete  01/04/2015   CLINICAL DATA:  34 year old  female with a history of abnormal vaginal bleeding for 11 days.  EXAM: TRANSABDOMINAL ULTRASOUND OF PELVIS  TECHNIQUE: Transabdominal ultrasound examination of the pelvis was performed including evaluation of the uterus, ovaries, adnexal regions, and pelvic cul-de-sac.  COMPARISON:  05/05/2006, 02/07/2005  FINDINGS: Uterus  Measurements: 9.4 cm x 4.0 cm x 4.8 cm. No fibroids or other mass visualized.  Endometrium  Thickness: 10 mm to 11 mm at the uterine fundus, with hypoechoic appearance, with the endometrium appearing to surround this hypoechoic focus at the fundus. Color images suggest the possibility of blood flow in a potential "stalk" configuration. The endometrium is of a more narrow and uniform thickness in the mid body of the uterus and towards the lower uterine segment. The focal thickening at the uterine fundus centered in the endometrium was present on the comparison ultrasound study dated 05/05/2006.  Right ovary  Measurements: 6.5 cm x 3.7 cm x 5.6 cm. Anechoic lesion associated with the right ovary measures 5.2 cm x 3.4 cm x 4.7 cm  Left ovary  Measurements: 1.5 cm x 3.0 cm x 3.1 cm. Unremarkable appearance of the left ovary.  Other findings:  No free fluid  IMPRESSION: Hypoechoic focus centered at the endometrial stripe in the uterine fundus, with linear blood flow extending to the focus, suggesting a uterine polyp. Further  evaluation with hysteroscopy may be useful. Differential diagnosis includes retained products given the patient's recent pregnancy, though this is considered less likely given the appearance.  Cystic lesion associated with the right ovary measures just over 5 cm. Annual ultrasound survey is recommended given the patient's age and appearance, as recommended by a SRU guidelines.  Signed,  Yvone Neu. Loreta Ave, DO  [facility]  [facility]   Electronically Signed   By: Gilmer Mor D.O.   On: 01/04/2015 21:32     Assessment and Plan  A: Retained products of conception vs new pregnancy   P: Discharge home Bleeding precautions discussed Patient advised to follow-up with MAU in 1 week for repeat labs or sooner PRN  Marny Lowenstein, PA-C  01/05/2015, 3:27 AM

## 2015-01-05 LAB — GC/CHLAMYDIA PROBE AMP (~~LOC~~) NOT AT ARMC
Chlamydia: NEGATIVE
Neisseria Gonorrhea: NEGATIVE

## 2015-01-12 ENCOUNTER — Inpatient Hospital Stay (HOSPITAL_COMMUNITY)
Admission: AD | Admit: 2015-01-12 | Discharge: 2015-01-12 | Disposition: A | Discharge status: Home / Self Care | Payer: Managed Care, Other (non HMO) | Source: Ambulatory Visit | Attending: Obstetrics & Gynecology | Admitting: Obstetrics & Gynecology

## 2015-01-12 DIAGNOSIS — N926 Irregular menstruation, unspecified: Secondary | ICD-10-CM | POA: Diagnosis present

## 2015-01-12 DIAGNOSIS — O0281 Inappropriate change in quantitative human chorionic gonadotropin (hCG) in early pregnancy: Secondary | ICD-10-CM | POA: Diagnosis not present

## 2015-01-12 LAB — ABO/RH: ABO/RH(D): O POS

## 2015-01-12 LAB — HCG, QUANTITATIVE, PREGNANCY: HCG, BETA CHAIN, QUANT, S: 9 m[IU]/mL — AB (ref <5)

## 2015-01-12 NOTE — MAU Provider Note (Signed)
Pt here for f/u HCG Pt seen on 01/04/2015 in MAU with some irregular bleeding s/p TAB on April 29 Pt had a neg UPT but serum  HCG 14 Pt advised to f/u in 1 week for repeat HCG since unsure if this is new pregnancy Today HCG is 9 Recommend pt to f/u in GYN clinic in 1 week for HCG Appointment made in yellow book for Wed 7/13 for lab

## 2015-01-12 NOTE — MAU Note (Signed)
Feeling fine.  No pain.  Still having the brownish d/c.

## 2015-01-19 ENCOUNTER — Other Ambulatory Visit: Payer: Managed Care, Other (non HMO)

## 2015-01-19 DIAGNOSIS — O039 Complete or unspecified spontaneous abortion without complication: Secondary | ICD-10-CM

## 2015-01-20 LAB — HCG, QUANTITATIVE, PREGNANCY: hCG, Beta Chain, Quant, S: 6.2 m[IU]/mL

## 2015-01-21 ENCOUNTER — Telehealth: Payer: Self-pay | Admitting: General Practice

## 2015-01-21 NOTE — Telephone Encounter (Signed)
Telephone call to patient to patient regarding bhcg results. Patient does not need further follow up. Called patient, no answer- left message stating we are trying to reach you with results, nothing urgent but please call us back at the clinics

## 2015-01-25 NOTE — Telephone Encounter (Signed)
Morrie Sheldonshley left another message asking for results . Called [place] and notified her of bhcg results and no follow up needed. Asked her if she was having any problems or questions. She denied any problems and no questions. Voices understanding.

## 2015-11-08 ENCOUNTER — Inpatient Hospital Stay (HOSPITAL_COMMUNITY)
Admission: AD | Admit: 2015-11-08 | Discharge: 2015-11-08 | Disposition: A | Discharge status: Home / Self Care | Payer: Managed Care, Other (non HMO) | Source: Ambulatory Visit | Attending: Family Medicine | Admitting: Family Medicine

## 2015-11-08 ENCOUNTER — Encounter (HOSPITAL_COMMUNITY): Payer: Self-pay | Admitting: *Deleted

## 2015-11-08 DIAGNOSIS — N3000 Acute cystitis without hematuria: Secondary | ICD-10-CM | POA: Diagnosis not present

## 2015-11-08 LAB — URINALYSIS, ROUTINE W REFLEX MICROSCOPIC
Bilirubin Urine: NEGATIVE
Glucose, UA: NEGATIVE mg/dL
Ketones, ur: NEGATIVE mg/dL
NITRITE: POSITIVE — AB
PROTEIN: NEGATIVE mg/dL
Specific Gravity, Urine: 1.025 (ref 1.005–1.030)
pH: 5.5 (ref 5.0–8.0)

## 2015-11-08 LAB — URINE MICROSCOPIC-ADD ON

## 2015-11-08 LAB — POCT PREGNANCY, URINE: Preg Test, Ur: NEGATIVE

## 2015-11-08 MED ORDER — PHENAZOPYRIDINE HCL 200 MG PO TABS: 200.0000 mg | ORAL_TABLET | Freq: Three times a day (TID) | ORAL | Status: AC

## 2015-11-08 MED ORDER — SULFAMETHOXAZOLE-TRIMETHOPRIM 800-160 MG PO TABS: 1.0000 | ORAL_TABLET | Freq: Two times a day (BID) | ORAL | Status: AC

## 2015-11-08 NOTE — MAU Provider Note (Signed)
History     CSN: [id number]  Arrival date and time: 11/08/15 4696   First Provider Initiated Contact with Patient 11/08/15 1952       Chief Complaint  Patient presents with  . Dysuria  . Urinary Frequency   Diane Sullivan is a 35 y.o. female who presents with complaints of dysuria & increased urinary frequency.   Dysuria  This is a new problem. The current episode started today. The problem occurs every urination. The problem has been unchanged. The quality of the pain is described as burning. The pain is at a severity of 5/10. There has been no fever. She is sexually active. There is no history of pyelonephritis. Associated symptoms include frequency. Pertinent negatives include no chills, discharge, flank pain, hematuria, nausea, possible pregnancy, urgency or vomiting. She has tried nothing for the symptoms. There is no history of catheterization, kidney stones, recurrent UTIs or a single kidney.    OB History    Gravida Para Term Preterm AB TAB SAB Ectopic Multiple Living   Past Medical History  Diagnosis Date  . No pertinent past medical history     Past Surgical History  Procedure Laterality Date  . No past surgeries      Family History  Problem Relation Age of Onset  . Hypertension Mother   . Cancer Maternal Aunt   . Cancer Maternal Grandmother   . Heart disease Neg Hx   . Diabetes Neg Hx     Social History  Substance Use Topics  . Smoking status: Never Smoker   . Smokeless tobacco: Never Used  . Alcohol Use: No    Allergies: No Known Allergies  No prescriptions prior to admission    Review of Systems  Constitutional: Negative for fever and chills.  Gastrointestinal: Positive for abdominal pain. Negative for nausea, vomiting, diarrhea and constipation.  Genitourinary: Positive for dysuria and frequency. Negative for urgency, hematuria and flank pain.  Musculoskeletal: Negative for back pain.   Physical Exam   Blood pressure  130/87, pulse 80, temperature 98.5 F (36.9 C), temperature source Oral, resp. rate 18, weight 178 lb 12.8 oz (81.103 kg), last menstrual period 11/01/2015, unknown if currently breastfeeding.  Physical Exam  Nursing note and vitals reviewed. Constitutional: She is oriented to person, place, and time. She appears well-developed and well-nourished. No distress.  HENT:  Head: Normocephalic and atraumatic.  Eyes: Conjunctivae are normal. Right eye exhibits no discharge. Left eye exhibits no discharge. No scleral icterus.  Neck: Normal range of motion.  Cardiovascular: Normal rate.   Respiratory: Effort normal. No respiratory distress.  GI: Soft. She exhibits no distension. There is no tenderness. There is no CVA tenderness.  Neurological: She is alert and oriented to person, place, and time.  Skin: Skin is warm and dry. She is not diaphoretic.  Psychiatric: She has a normal mood and affect. Her behavior is normal. Judgment and thought content normal.    MAU Course  Procedures Results for orders placed or performed during the hospital encounter of 11/08/15 (from the past 24 hour(s))  Urinalysis, Routine w reflex microscopic (not at [facility])     Status: Abnormal   Collection Time: 11/08/15  7:05 PM  Result Value Ref Range   Color, Urine YELLOW YELLOW   APPearance CLEAR CLEAR   Specific Gravity, Urine 1.025 1.005 - 1.030   pH 5.5 5.0 - 8.0   Glucose, UA NEGATIVE NEGATIVE  mg/dL   Hgb urine dipstick MODERATE (A) NEGATIVE   Bilirubin Urine NEGATIVE NEGATIVE   Ketones, ur NEGATIVE NEGATIVE mg/dL   Protein, ur NEGATIVE NEGATIVE mg/dL   Nitrite POSITIVE (A) NEGATIVE   Leukocytes, UA SMALL (A) NEGATIVE  Urine microscopic-add on     Status: Abnormal   Collection Time: 11/08/15  7:05 PM  Result Value Ref Range   Squamous Epithelial / LPF 0-5 (A) NONE SEEN   WBC, UA 6-30 0 - 5 WBC/hpf   RBC / HPF 0-5 0 - 5 RBC/hpf   Bacteria, UA MANY (A) NONE SEEN  Pregnancy, urine POC     Status: None    Collection Time: 11/08/15  7:16 PM  Result Value Ref Range   Preg Test, Ur NEGATIVE NEGATIVE    MDM UPT negative Will treat for UTI & send urine for culture Assessment and Plan  A: 1. Acute cystitis without hematuria    P; Discharge home Rx keflex & pyridium Urine culture pending If symptoms worsen go to PCP, urgent care, or ED  [name] 11/08/2015, 7:52 PM

## 2015-11-08 NOTE — MAU Note (Signed)
started today, using the restroom.frequency, urgency, only a little amt when she goes, some pain after urination.  abd cramping today.

## 2015-11-08 NOTE — Discharge Instructions (Signed)

## 2015-11-11 LAB — URINE CULTURE: Culture: >=100000 — AB

## 2016-10-27 IMAGING — US US PELVIS COMPLETE
1 series · 13 of 25 positions shown · non-contrast
Comparison: 05/05/2006, 02/07/2005

CLINICAL DATA: 34-year-old female with a history of abnormal
vaginal bleeding for 11 days.

EXAM:
TRANSABDOMINAL ULTRASOUND OF PELVIS
TECHNIQUE: Transabdominal ultrasound examination of the pelvis was performed
including evaluation of the uterus, ovaries, adnexal regions, and
pelvic cul-de-sac.

[Series 1: us non-ob tv/pel · 130 acquisitions, 13 frames shown]
[im 1/130]
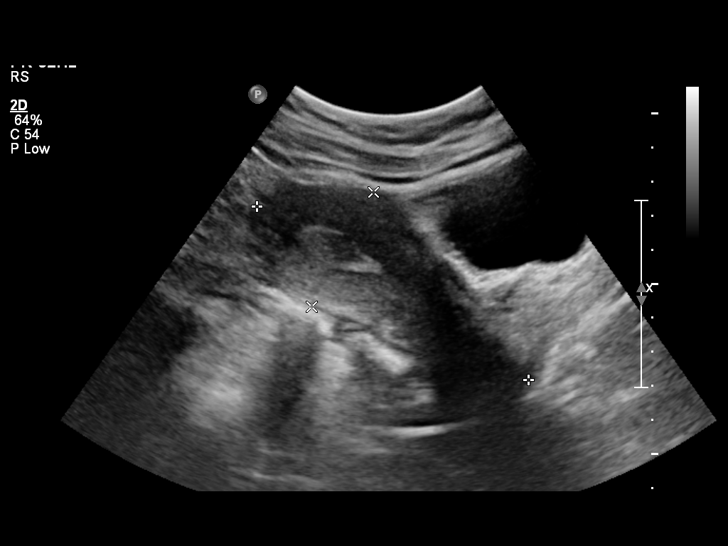
[im 11/130]
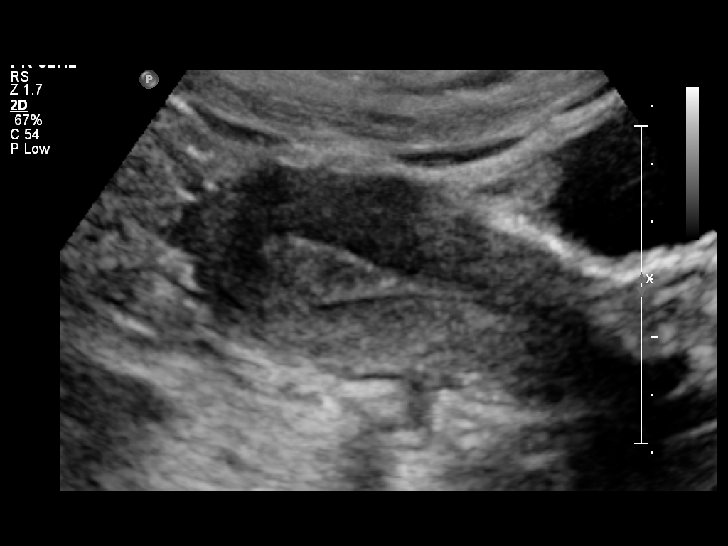
[im 22/130]
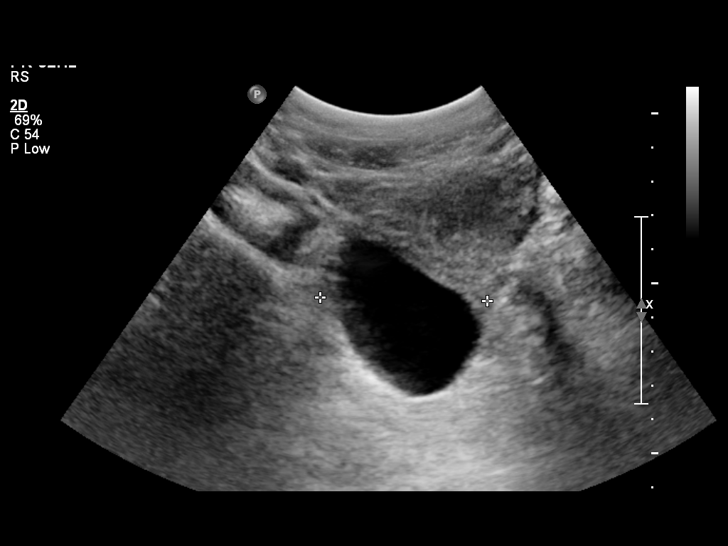
[im 33/130]
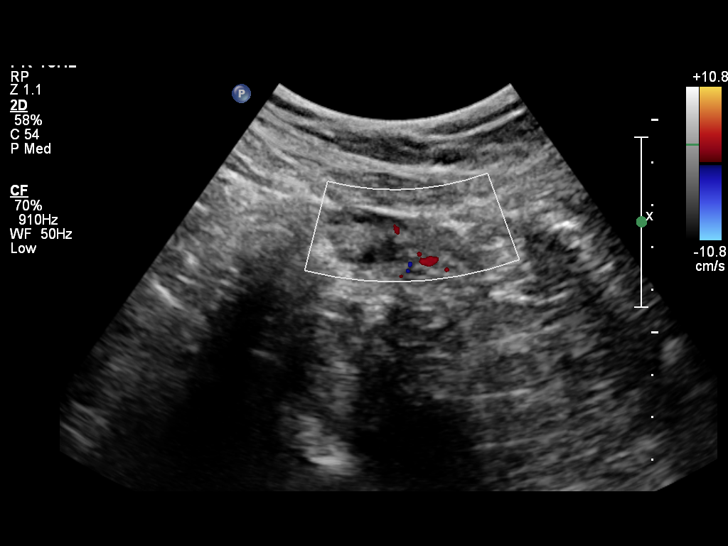
[im 44/130]
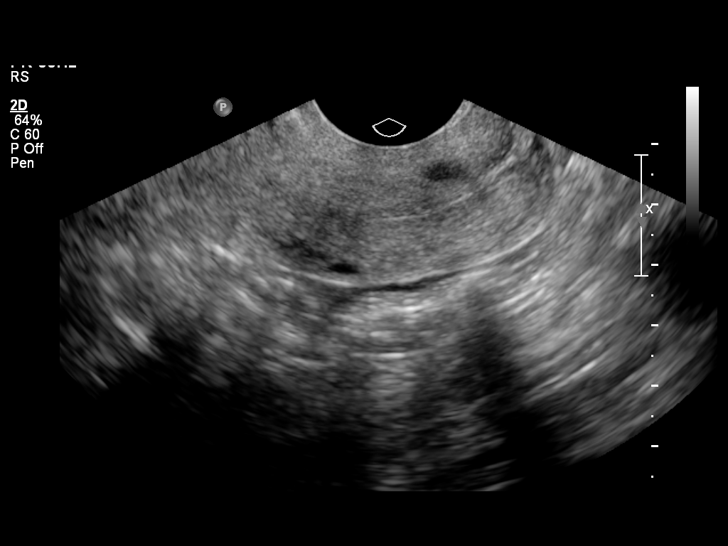
[im 54/130]
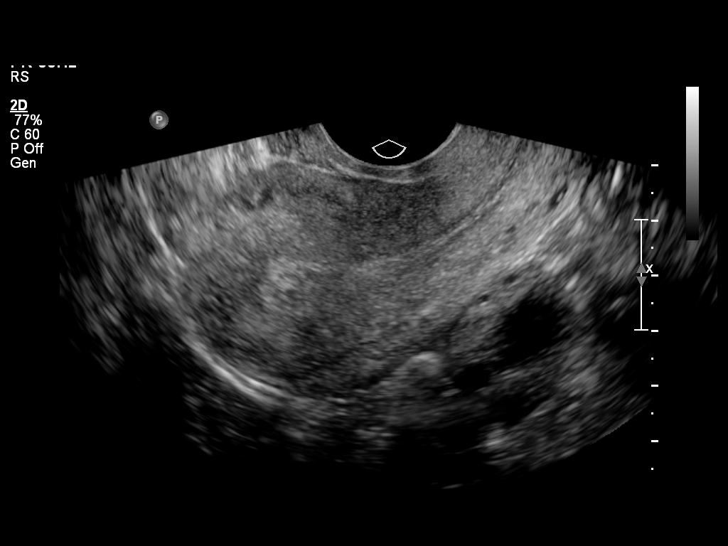
[im 65/130]
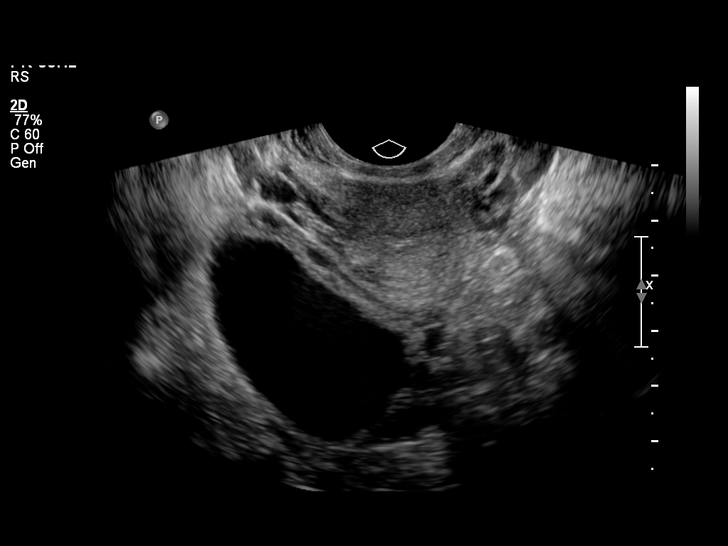
[im 76/130]
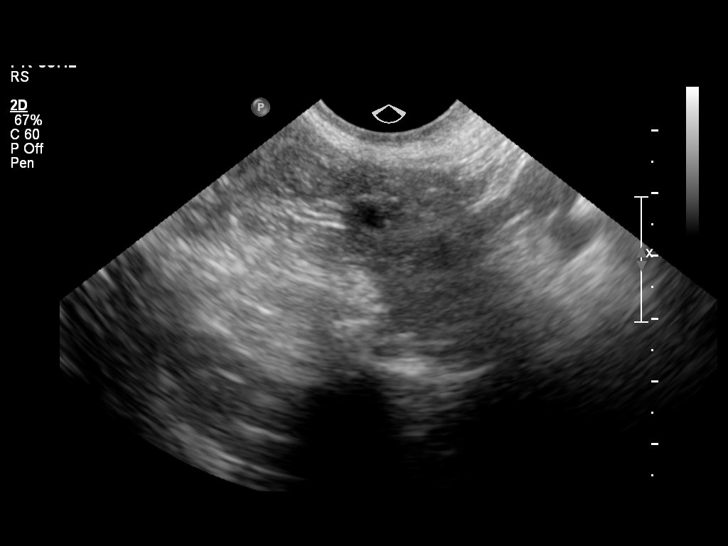
[im 87/130]
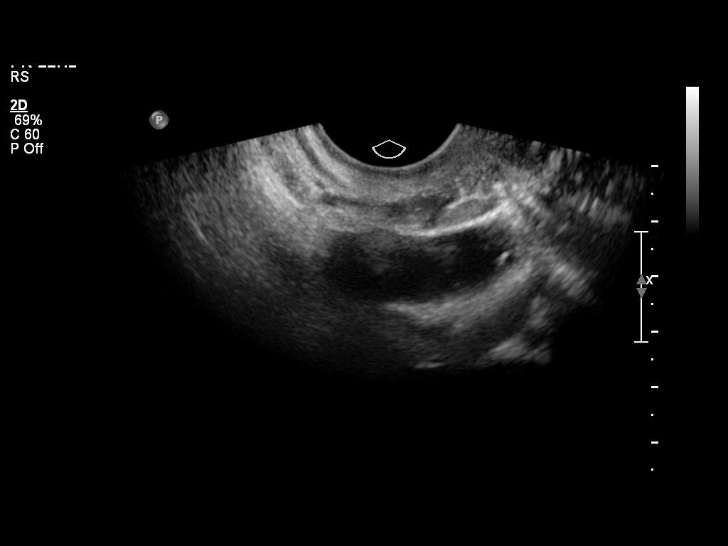
[im 97/130]
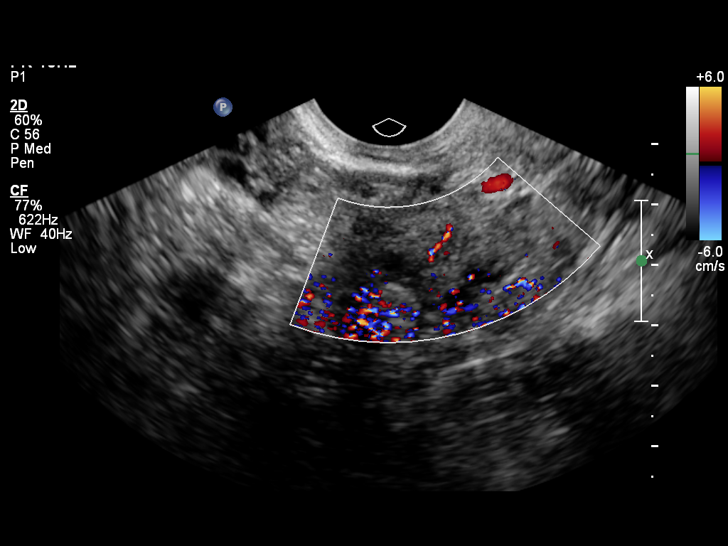
[im 108/130]
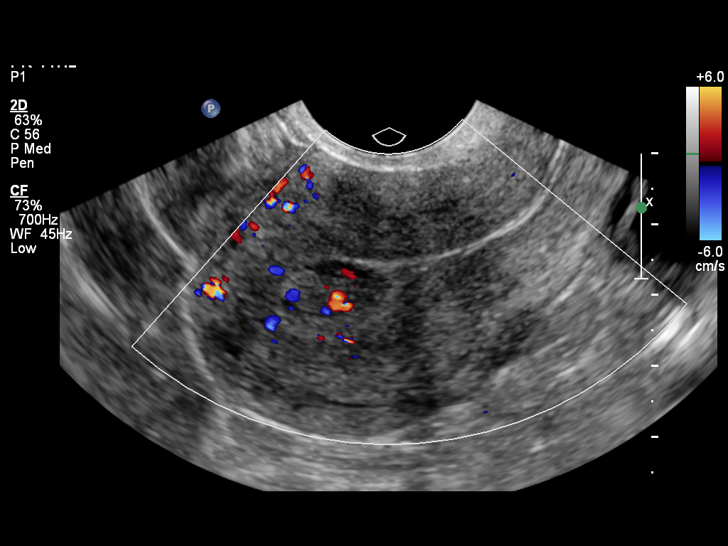
[im 119/130]
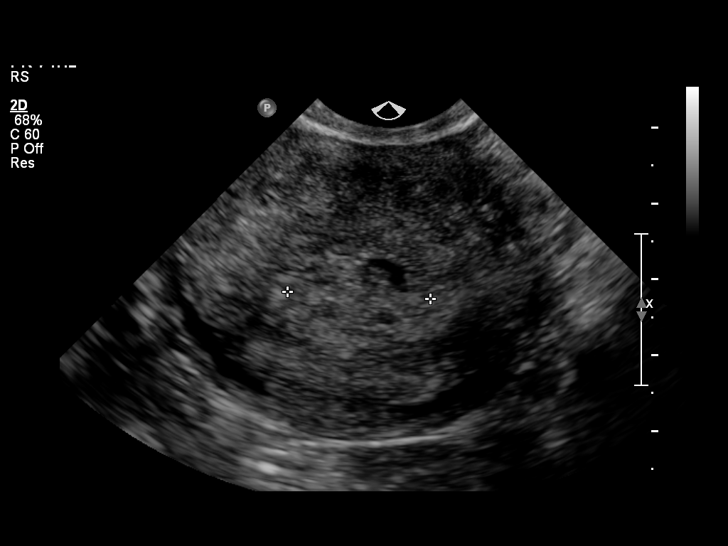
[im 130/130]
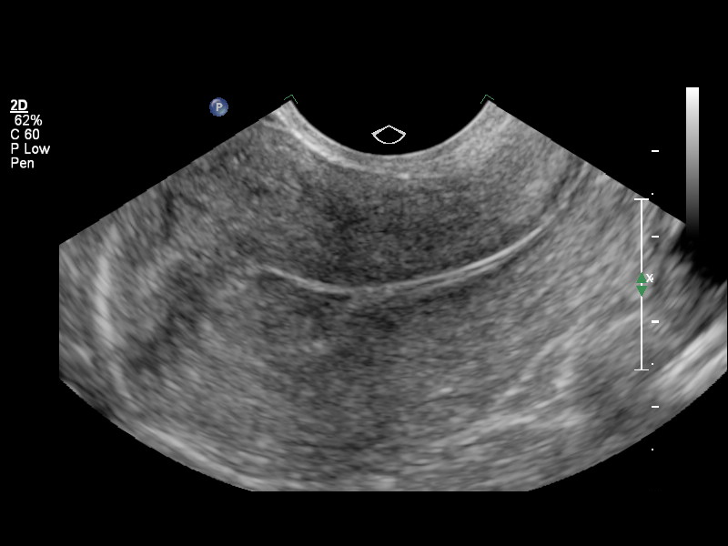

[13 of 25 positions shown; findings below may reference images not displayed]

FINDINGS: Uterus

Measurements: 9.4 cm x 4.0 cm x 4.8 cm. No fibroids or other mass
visualized.

Endometrium

Thickness: 10 mm to 11 mm at the uterine fundus, with hypoechoic
appearance, with the endometrium appearing to surround this
hypoechoic focus at the fundus. Color images suggest the possibility
of blood flow in a potential "stalk" configuration. The endometrium
is of a more narrow and uniform thickness in the mid body of the
uterus and towards the lower uterine segment. The focal thickening
at the uterine fundus centered in the endometrium was present on the
comparison ultrasound study dated 05/05/2006.

Right ovary

Measurements: 6.5 cm x 3.7 cm x 5.6 cm. Anechoic lesion associated
with the right ovary measures 5.2 cm x 3.4 cm x 4.7 cm

Left ovary

Measurements: 1.5 cm x 3.0 cm x 3.1 cm. Unremarkable appearance of
the left ovary.

Other findings:  No free fluid
IMPRESSION: Hypoechoic focus centered at the endometrial stripe in the uterine
fundus, with linear blood flow extending to the focus, suggesting a
uterine polyp. Further evaluation with hysteroscopy may be useful.
Differential diagnosis includes retained products given the
patient's recent pregnancy, though this is considered less likely
given the appearance.

Cystic lesion associated with the right ovary measures just over 5
cm. Annual ultrasound survey is recommended given the patient's age
and appearance, as recommended by a SRU guidelines.

## 2017-09-16 ENCOUNTER — Encounter (HOSPITAL_COMMUNITY): Payer: Self-pay

## 2017-09-16 ENCOUNTER — Emergency Department (HOSPITAL_COMMUNITY)
Admission: EM | Admit: 2017-09-16 | Discharge: 2017-09-16 | Disposition: A | Discharge status: Home / Self Care | Payer: Managed Care, Other (non HMO) | Attending: Emergency Medicine | Admitting: Emergency Medicine

## 2017-09-16 DIAGNOSIS — N61 Mastitis without abscess: Secondary | ICD-10-CM | POA: Insufficient documentation

## 2017-09-16 DIAGNOSIS — Z79899 Other long term (current) drug therapy: Secondary | ICD-10-CM | POA: Insufficient documentation

## 2017-09-16 DIAGNOSIS — N644 Mastodynia: Secondary | ICD-10-CM

## 2017-09-16 MED ORDER — NAPROXEN 500 MG PO TABS: 500.0000 mg | ORAL_TABLET | Freq: Two times a day (BID) | ORAL | 0 refills | Status: AC

## 2017-09-16 MED ORDER — CEPHALEXIN 500 MG PO CAPS: 500.0000 mg | ORAL_CAPSULE | Freq: Four times a day (QID) | ORAL | 0 refills | Status: DC

## 2017-09-16 NOTE — Discharge Instructions (Signed)
I am treating you with antibiotics because this looks like an infection; however, it is very important that you [facility] with [facility] for further [facility] but be sure there is no other findings that need treatment.

## 2017-09-16 NOTE — ED Triage Notes (Signed)
Pt presents with c/o right breast pain that started last night. Pt reports she has a nipple piercing but has had it for years with no issues. Pt denies any discharge from the nipple.

## 2017-09-16 NOTE — ED Provider Notes (Signed)
[facility]-EMERGENCY DEPT Provider Note   CSN: [id number] Arrival date & time: 09/16/17  1937     History   Chief Complaint Chief Complaint  Patient presents with  . Breast Pain    HPI Diane Sullivan is a 37 y.o. female who presents to the ED with breast pain. The pain started last night. The patient reports she had a nipple piercing in the area of pain but the piercing has been there for years without problems. No nipple discharge.   HPI  Past Medical History:  Diagnosis Date  . No pertinent past medical history     Patient Active Problem List   Diagnosis Date Noted  . 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 NO PAST SURGERIES      OB History    Gravida Para Term Preterm AB Living   3 1 1   1 1    SAB TAB Ectopic Multiple Live Births     1     1       Home Medications    Prior to Admission medications   Medication Sig Start Date End Date Taking? Authorizing Provider  cephALEXin (KEFLEX) 500 MG capsule Take 1 capsule (500 mg total) by mouth 4 (four) times daily. 09/16/17   Janne NapoleonNeese, Jacqualyn Sedgwick M, NP  naproxen (NAPROSYN) 500 MG tablet Take 1 tablet (500 mg total) by mouth 2 (two) times daily. 09/16/17   Janne NapoleonNeese, Pacen Watford M, NP  phenazopyridine (PYRIDIUM) 200 MG tablet Take 1 tablet (200 mg total) by mouth 3 (three) times daily. 11/08/15   Judeth HornLawrence, Erin, NP  sulfamethoxazole-trimethoprim (BACTRIM DS,SEPTRA DS) 800-160 MG tablet Take 1 tablet by mouth 2 (two) times daily. 11/08/15   Judeth HornLawrence, Erin, NP    Family History Family History  Problem Relation Age of Onset  . Hypertension Mother   . Cancer Maternal Aunt   . Cancer Maternal Grandmother   . Heart disease Neg Hx   . Diabetes Neg Hx     Social History Social History   Tobacco Use  . Smoking status: Never Smoker  . Smokeless tobacco: Never Used  Substance Use Topics  . Alcohol use: No  . Drug use:  No     Allergies   Patient has no known allergies.   Review of Systems Review of Systems  Respiratory:       Breast tenderness  All other systems reviewed and are negative.    Physical Exam Updated Vital Signs BP (!) 128/95 (BP Location: Right Arm)   Pulse 84   Temp 98.4 F (36.9 C) (Oral)   Resp 16   Ht 5\' 3"  (1.6 m)   Wt 79.4 kg (175 lb)   LMP 09/06/2017 (Approximate)   SpO2 98%   BMI 31.00 kg/m   Physical Exam  Constitutional: She appears well-developed and well-nourished. No distress.  HENT:  Head: Normocephalic.  Eyes: EOM are normal.  Neck: Neck supple.  Cardiovascular: Normal rate.  Pulmonary/Chest: Effort normal. Right breast exhibits mass.    Genitourinary: There is breast tenderness.  Musculoskeletal: Normal range of motion.  Neurological: She is alert.  Skin: Skin is warm and dry.  Psychiatric: She has a normal mood and affect.  Nursing note and vitals reviewed.    ED Treatments / Results  Labs (all labs ordered are listed, but only abnormal results are displayed) Labs Reviewed - No data to display   Radiology  No results found.  Procedures Procedures (including critical care time)  Medications Ordered in ED Medications - No data to display   Initial Impression / Assessment and Plan / ED Course  I have reviewed the triage vital signs and the nursing notes. 37 y.o. female here with right nipple swelling and tenderness x 24 hours stable for d/c [facility]. Will start Keflex in the event this is infection. Discussed with the patient importance of f/u for additional screening.patient agrees with plan.  Final Clinical Impressions(s) / ED Diagnoses   Final diagnoses:  Breast pain, right  Breast infection    ED Discharge Orders        Ordered    cephALEXin (KEFLEX) 500 MG capsule  4 times daily     09/16/17 2057    naproxen (NAPROSYN) 500 MG tablet  2 times daily     09/16/17 2057       Kerrie [place],  [place] 09/16/17 2105    Linwood Dibbles, MD 09/16/17 [phone]

## 2019-10-19 ENCOUNTER — Ambulatory Visit: Payer: Managed Care, Other (non HMO) | Attending: Internal Medicine

## 2019-10-19 DIAGNOSIS — Z20822 Contact with and (suspected) exposure to covid-19: Secondary | ICD-10-CM

## 2019-10-20 LAB — NOVEL CORONAVIRUS, NAA: SARS-CoV-2, NAA: DETECTED — AB

## 2019-10-20 LAB — SARS-COV-2, NAA 2 DAY TAT

## 2019-10-28 ENCOUNTER — Ambulatory Visit: Payer: Managed Care, Other (non HMO)

## 2019-10-28 ENCOUNTER — Other Ambulatory Visit: Payer: Self-pay

## 2022-04-10 ENCOUNTER — Other Ambulatory Visit: Payer: Self-pay

## 2022-04-10 ENCOUNTER — Emergency Department (HOSPITAL_BASED_OUTPATIENT_CLINIC_OR_DEPARTMENT_OTHER)
Admission: EM | Admit: 2022-04-10 | Discharge: 2022-04-10 | Disposition: A | Discharge status: Home / Self Care | Payer: Managed Care, Other (non HMO) | Attending: Emergency Medicine | Admitting: Emergency Medicine

## 2022-04-10 ENCOUNTER — Encounter (HOSPITAL_BASED_OUTPATIENT_CLINIC_OR_DEPARTMENT_OTHER): Payer: Self-pay

## 2022-04-10 DIAGNOSIS — N3001 Acute cystitis with hematuria: Secondary | ICD-10-CM | POA: Insufficient documentation

## 2022-04-10 DIAGNOSIS — M549 Dorsalgia, unspecified: Secondary | ICD-10-CM | POA: Insufficient documentation

## 2022-04-10 LAB — URINALYSIS, ROUTINE W REFLEX MICROSCOPIC
Bilirubin Urine: NEGATIVE
Glucose, UA: NEGATIVE mg/dL
Ketones, ur: NEGATIVE mg/dL
Nitrite: NEGATIVE
Protein, ur: 30 mg/dL — AB
RBC / HPF: >50 RBC/hpf — ABNORMAL HIGH (ref 0–5)
Specific Gravity, Urine: 1.026 (ref 1.005–1.030)
WBC, UA: >50 WBC/hpf — ABNORMAL HIGH (ref 0–5)
pH: 5.5 (ref 5.0–8.0)

## 2022-04-10 LAB — PREGNANCY, URINE: Preg Test, Ur: NEGATIVE

## 2022-04-10 MED ORDER — CEPHALEXIN 250 MG PO CAPS
500.0000 mg | ORAL_CAPSULE | Freq: Once | ORAL | Status: AC
  Administered 2022-04-10: 500 mg via ORAL
  Filled 2022-04-10: qty 2

## 2022-04-10 MED ORDER — CEPHALEXIN 500 MG PO CAPS: 500.0000 mg | ORAL_CAPSULE | Freq: Two times a day (BID) | ORAL | 0 refills | Status: AC

## 2022-04-10 NOTE — Discharge Instructions (Addendum)
Take the antibiotics as directed.  Return to the emergency room if you have any worsening symptoms including fevers, worsening abdominal or back pain, vomiting or other worsening symptoms.

## 2022-04-10 NOTE — ED Provider Notes (Signed)
[facility] EMERGENCY DEPT Provider Note   CSN: [id number] Arrival date & time: 04/10/22  1817     History  Chief Complaint  Patient presents with   Urinary Frequency    Diane Sullivan is a 41 y.o. female.  Patient is a 41 year old female who presents with urinary frequency and urgency.  She says is been going on about 2 days.  She has little bit of pain in her back and left flank area although she says it is mild.  No nausea or vomiting.  No abdominal pain.  No known fevers.  No visible hematuria.       Home Medications Prior to Admission medications   Medication Sig Start Date End Date Taking? Authorizing Provider  cephALEXin (KEFLEX) 500 MG capsule Take 1 capsule (500 mg total) by mouth 2 (two) times daily. 04/10/22  Yes Rolan Bucco, MD  naproxen (NAPROSYN) 500 MG tablet Take 1 tablet (500 mg total) by mouth 2 (two) times daily. 09/16/17   Janne Napoleon, NP  phenazopyridine (PYRIDIUM) 200 MG tablet Take 1 tablet (200 mg total) by mouth 3 (three) times daily. 11/08/15   Judeth Horn, NP  sulfamethoxazole-trimethoprim (BACTRIM DS,SEPTRA DS) 800-160 MG tablet Take 1 tablet by mouth 2 (two) times daily. 11/08/15   Judeth Horn, NP      Allergies    Patient has no known allergies.    Review of Systems   Review of Systems  Constitutional:  Negative for chills, diaphoresis, fatigue and fever.  HENT:  Negative for congestion, rhinorrhea and sneezing.   Eyes: Negative.   Respiratory:  Negative for cough, chest tightness and shortness of breath.   Cardiovascular:  Negative for chest pain and leg swelling.  Gastrointestinal:  Negative for abdominal pain, blood in stool, diarrhea, nausea and vomiting.  Genitourinary:  Positive for dysuria and urgency. Negative for difficulty urinating, flank pain, frequency, hematuria, vaginal bleeding and vaginal discharge.  Musculoskeletal:  Positive for back pain. Negative for arthralgias.  Skin:  Negative for rash.   Neurological:  Negative for dizziness, speech difficulty, weakness, numbness and headaches.    Physical Exam Updated Vital Signs BP (!) 146/98   Pulse 86   Temp 98.2 F (36.8 C)   Resp 18   Ht 5\' 3"  (1.6 m)   Wt 83.9 kg   LMP 03/24/2022   SpO2 99%   BMI 32.77 kg/m  Physical Exam Constitutional:      Appearance: She is well-developed.  HENT:     Head: Normocephalic and atraumatic.  Eyes:     Pupils: Pupils are equal, round, and reactive to light.  Cardiovascular:     Rate and Rhythm: Normal rate and regular rhythm.     Heart sounds: Normal heart sounds.  Pulmonary:     Effort: Pulmonary effort is normal. No respiratory distress.     Breath sounds: Normal breath sounds. No wheezing or rales.  Chest:     Chest wall: No tenderness.  Abdominal:     General: Bowel sounds are normal.     Palpations: Abdomen is soft.     Tenderness: There is no abdominal tenderness. There is no guarding or rebound.     Comments: No CVA tenderness  Musculoskeletal:        General: Normal range of motion.     Cervical back: Normal range of motion and neck supple.  Lymphadenopathy:     Cervical: No cervical adenopathy.  Skin:    General: Skin is warm and dry.  Findings: No rash.  Neurological:     Mental Status: She is alert and oriented to person, place, and time.     ED Results / Procedures / Treatments   Labs (all labs ordered are listed, but only abnormal results are displayed) Labs Reviewed  URINALYSIS, ROUTINE W REFLEX MICROSCOPIC - Abnormal; Notable for the following components:      Result Value   APPearance HAZY (*)    Hgb urine dipstick LARGE (*)    Protein, ur 30 (*)    Leukocytes,Ua LARGE (*)    RBC / HPF >50 (*)    WBC, UA >50 (*)    Bacteria, UA RARE (*)    All other components within normal limits  URINE CULTURE  PREGNANCY, URINE    EKG None  Radiology No results found.  Procedures Procedures    Medications Ordered in ED Medications  cephALEXin  (KEFLEX) capsule 500 mg (has no administration in time range)    ED Course/ Medical Decision Making/ A&P                           Medical Decision Making Problems Addressed: Acute cystitis with hematuria: undiagnosed new problem with uncertain prognosis  Amount and/or Complexity of Data Reviewed Labs: ordered. Decision-making details documented in ED Course.  Risk Prescription drug management. Decision regarding hospitalization.   Patient is a 41 year old female who presents with urinary symptoms.  She does not appear systemically ill.  Her urine does show signs of infection.  Her pregnancy test is negative.  She does have associated hematuria.  She has little bit of back and flank pain.  I discussed with her doing a CT scan to rule out a kidney stone.  However this point she feels like her symptoms are very mild and she thinks it is more consistent with a UTI and does not want to stay for further evaluation.  We will go ahead and start her on antibiotics.  She does not have suggestions of pyelonephritis so I feel that outpatient treatment is appropriate.  I discussed strict return precautions with her and advised her that if she did have a kidney stone, this and associated with an infection can worsen really fast.  I feel her symptoms are most concerning with a UTI but cannot completely exclude a kidney stone.  However she does not want stay for further evaluation.  She was discharged home in good condition.  She was started on antibiotics.  Return precautions were given.   Final Clinical Impression(s) / ED Diagnoses Final diagnoses:  Acute cystitis with hematuria    Rx / DC Orders ED Discharge Orders          Ordered    cephALEXin (KEFLEX) 500 MG capsule  2 times daily        04/10/22 2207              Malvin Johns, MD 04/10/22 2211

## 2022-04-10 NOTE — ED Triage Notes (Signed)
Pt states frequency with urination and is only able to get out small amounts at a time.

## 2022-04-13 LAB — URINE CULTURE: Culture: >=100000 — AB

## 2022-04-14 ENCOUNTER — Telehealth (HOSPITAL_BASED_OUTPATIENT_CLINIC_OR_DEPARTMENT_OTHER): Payer: Self-pay | Admitting: *Deleted

## 2022-04-14 NOTE — Telephone Encounter (Signed)
Post ED Visit - Positive Culture Follow-up  Culture report reviewed by antimicrobial stewardship pharmacist: [facility] []  Elenor Quinones, Pharm.D. []  Heide Guile, Pharm.D., BCPS AQ-ID []  Parks Neptune, Pharm.D., BCPS []  Alycia Rossetti, Pharm.D., BCPS []  [place], Pharm.D., BCPS, AAHIVP []  Legrand Como, Pharm.D., BCPS, AAHIVP []  Salome Arnt, PharmD, BCPS []  Johnnette Gourd, PharmD, BCPS []  Hughes Better, PharmD, BCPS []  Leeroy Cha, PharmD []  Laqueta Linden, PharmD, BCPS [x]  Franchot Gallo, PharmD  [facility] []  Leodis Sias, PharmD []  Lindell Spar, PharmD []  Royetta Asal, PharmD []  Graylin Shiver, Rph []  Rema Fendt) Glennon Mac, PharmD []  Arlyn Dunning, PharmD []  Netta Cedars, PharmD []  Dia Sitter, PharmD []  Leone Haven, PharmD []  Gretta Arab, PharmD []  Theodis Shove, PharmD []  Peggyann Juba, PharmD []  Reuel Boom, PharmD   Positive urine culture Treated with Cephalexin, organism sensitive to the same and no further patient follow-up is required at this time.  Rosie Fate 04/14/2022, 1:14 PM
